# Patient Record
Sex: Female | Born: 1995 | Race: Black or African American | Hispanic: No | Marital: Single | Smoking: Light tobacco smoker
Health system: Southern US, Community
[De-identification: ages and names within clinical notes are randomized; demographics above are authoritative.]

---
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
## 2015-03-01 ENCOUNTER — Emergency Department (HOSPITAL_COMMUNITY): Payer: Medicaid Other

## 2015-03-01 ENCOUNTER — Emergency Department (HOSPITAL_COMMUNITY)
Admission: EM | Admit: 2015-03-01 | Discharge: 2015-03-01 | Disposition: A | Discharge status: Home / Self Care | Payer: Medicaid Other | Attending: Emergency Medicine | Admitting: Emergency Medicine

## 2015-03-01 ENCOUNTER — Encounter (HOSPITAL_COMMUNITY): Payer: Self-pay | Admitting: Nurse Practitioner

## 2015-03-01 DIAGNOSIS — F17210 Nicotine dependence, cigarettes, uncomplicated: Secondary | ICD-10-CM | POA: Insufficient documentation

## 2015-03-01 DIAGNOSIS — Y998 Other external cause status: Secondary | ICD-10-CM | POA: Diagnosis not present

## 2015-03-01 DIAGNOSIS — Y9389 Activity, other specified: Secondary | ICD-10-CM | POA: Diagnosis not present

## 2015-03-01 DIAGNOSIS — W268XXA Contact with other sharp object(s), not elsewhere classified, initial encounter: Secondary | ICD-10-CM | POA: Insufficient documentation

## 2015-03-01 DIAGNOSIS — S61411A Laceration without foreign body of right hand, initial encounter: Secondary | ICD-10-CM | POA: Insufficient documentation

## 2015-03-01 DIAGNOSIS — Y9289 Other specified places as the place of occurrence of the external cause: Secondary | ICD-10-CM | POA: Insufficient documentation

## 2015-03-01 NOTE — Discharge Instructions (Signed)
Nonsutured Laceration Care °A laceration is a cut that goes through all layers of the skin and extends into the tissue that is right under the skin. This type of cut is usually stitched up (sutured) or closed with tape (adhesive strips) or skin glue shortly after the injury happens. °However, if the wound is dirty or if several hours pass before medical treatment is provided, it is likely that germs (bacteria) will enter the wound. Closing a laceration after bacteria have entered it increases the risk of infection. In these cases, your health care provider may leave the laceration open (nonsutured) and cover it with a bandage. This type of treatment helps prevent infection and allows the wound to heal from the deepest layer of tissue damage up to the surface. °An open fracture is a type of injury that may involve nonsutured lacerations. An open fracture is a break in a bone that happens along with one or more lacerations through the skin that is near the fracture site. °HOW TO CARE FOR YOUR NONSUTURED LACERATION °· Take or apply over-the-counter and prescription medicines only as told by your health care provider. °· If you were prescribed an antibiotic medicine, take or apply it as told by your health care provider. Do not stop using the antibiotic even if your condition improves. °· Clean the wound one time each day or as told by your health care provider. °¨ Wash the wound with mild soap and water. °¨ Rinse the wound with water to remove all soap. °¨ Pat your wound dry with a clean towel. Do not rub the wound. °· Do not inject anything into the wound unless your health care provider told you to. °· Change any bandages (dressings) as told by your health care provider. This includes changing the dressing if it gets wet, dirty, or starts to smell bad. °· Keep the dressing dry until your health care provider says it can be removed. Do not take baths, swim, or do anything that puts your wound underwater until your  health care provider approves. °· Raise (elevate) the injured area above the level of your heart while you are sitting or lying down, if possible. °· Do not scratch or pick at the wound. °· Check your wound every day for signs of infection. Watch for: °¨ Redness, swelling, or pain. °¨ Fluid, blood, or pus. °· Keep all follow-up visits as told by your health care provider. This is important. °SEEK MEDICAL CARE IF: °· You received a tetanus and shot and you have swelling, severe pain, redness, or bleeding at the injection site.   °· You have a fever. °· Your pain is not controlled with medicine. °· You have increased redness, swelling, or pain at the site of your wound. °· You have fluid, blood, or pus coming from your wound. °· You notice a bad smell coming from your wound or your dressing. °· You notice something coming out of the wound, such as wood or glass. °· You notice a change in the color of your skin near your wound. °· You develop a new rash. °· You need to change the dressing frequently due to fluid, blood, or pus draining from the wound. °· You develop numbness around your wound. °SEEK IMMEDIATE MEDICAL CARE IF: °· Your pain suddenly increases and is severe. °· You develop severe swelling around the wound. °· The wound is on your hand or foot and you cannot properly move a finger or toe. °· The wound is on your hand or   foot and you notice that your fingers or toes look pale or bluish.  You have a red streak going away from your wound.   This information is not intended to replace advice given to you by your health care provider. Make sure you discuss any questions you have with your health care provider.   Document Released: 02/09/2006 Document Revised: 07/29/2014 Document Reviewed: 03/10/2014 Elsevier Interactive Patient Education 2016 [employer]  Laceration Care, Adult A laceration is a cut that goes through all of the layers of the skin and into the tissue that is right under the skin.  Some lacerations heal on their own. Others need to be closed with stitches (sutures), staples, skin adhesive strips, or skin glue. Proper laceration care minimizes the risk of infection and helps the laceration to heal better. HOW TO CARE FOR YOUR LACERATION If sutures or staples were used:  Keep the wound clean and dry.  If you were given a bandage (dressing), you should change it at least one time per day or as told by your health care provider. You should also change it if it becomes wet or dirty.  Keep the wound completely dry for the first 24 hours or as told by your health care provider. After that time, you may shower or bathe. However, make sure that the wound is not soaked in water until after the sutures or staples have been removed.  Clean the wound one time each day or as told by your health care provider:  Wash the wound with soap and water.  Rinse the wound with water to remove all soap.  Pat the wound dry with a clean towel. Do not rub the wound.  After cleaning the wound, apply a thin layer of antibiotic ointmentas told by your health care provider. This will help to prevent infection and keep the dressing from sticking to the wound.  Have the sutures or staples removed as told by your health care provider. If skin adhesive strips were used:  Keep the wound clean and dry.  If you were given a bandage (dressing), you should change it at least one time per day or as told by your health care provider. You should also change it if it becomes dirty or wet.  Do not get the skin adhesive strips wet. You may shower or bathe, but be careful to keep the wound dry.  If the wound gets wet, pat it dry with a clean towel. Do not rub the wound.  Skin adhesive strips fall off on their own. You may trim the strips as the wound heals. Do not remove skin adhesive strips that are still stuck to the wound. They will fall off in time. If skin glue was used:  Try to keep the wound dry,  but you may briefly wet it in the shower or bath. Do not soak the wound in water, such as by swimming.  After you have showered or bathed, gently pat the wound dry with a clean towel. Do not rub the wound.  Do not do any activities that will make you sweat heavily until the skin glue has fallen off on its own.  Do not apply liquid, cream, or ointment medicine to the wound while the skin glue is in place. Using those may loosen the film before the wound has healed.  If you were given a bandage (dressing), you should change it at least one time per day or as told by your health care provider. You should  make you sweat heavily until the skin glue has fallen off on its own.   Do not apply liquid, cream, or ointment medicine to the wound while the skin glue is in place. Using those may loosen the film before the wound has healed.   If you were given a bandage (dressing), you should change it at least one time per day or as told by your health care provider. You should also change it if it becomes dirty or wet.   If a dressing is placed over the wound, be careful not to apply tape directly over the skin glue. Doing that may cause the glue to be pulled off before the wound has healed.   Do not pick at the glue. The skin glue usually remains in place for 5-10 days, then it falls off of the skin.  General Instructions   Take over-the-counter and prescription medicines only as told by your health care provider.   If you were prescribed an antibiotic medicine or ointment, take or apply it as told by your doctor. Do not stop using it even if your condition improves.   To help prevent scarring, make sure to cover your wound with sunscreen whenever you are outside after stitches are removed, after adhesive strips are removed, or when glue remains in place and the wound is healed. Make sure to wear a sunscreen of at least 30 SPF.   Do not scratch or pick at the wound.   Keep all follow-up visits as told by your health care provider. This is important.   Check your wound every day for signs of infection. Watch for:    Redness, swelling, or pain.    Fluid, blood, or pus.   Raise (elevate) the injured area above the level of your heart while you are sitting or lying down, if possible.  SEEK MEDICAL CARE IF:   You received a tetanus shot and you have swelling, severe pain, redness, or  bleeding at the injection site.   You have a fever.   A wound that was closed breaks open.   You notice a bad smell coming from your wound or your dressing.   You notice something coming out of the wound, such as wood or glass.   Your pain is not controlled with medicine.   You have increased redness, swelling, or pain at the site of your wound.   You have fluid, blood, or pus coming from your wound.   You notice a change in the color of your skin near your wound.   You need to change the dressing frequently due to fluid, blood, or pus draining from the wound.   You develop a new rash.   You develop numbness around the wound.  SEEK IMMEDIATE MEDICAL CARE IF:   You develop severe swelling around the wound.   Your pain suddenly increases and is severe.   You develop painful lumps near the wound or on skin that is anywhere on your body.   You have a red streak going away from your wound.   The wound is on your hand or foot and you cannot properly move a finger or toe.   The wound is on your hand or foot and you notice that your fingers or toes look pale or bluish.     This information is not intended to replace advice given to you by your health care provider. Make sure you discuss any questions you have with your health care provider.

## 2015-03-01 NOTE — ED Notes (Signed)
Pt presents with a right palm laceration that she reports she sustained from a tin cutter. Bleeding controlled at this time.

## 2015-03-01 NOTE — ED Provider Notes (Signed)
CSN: [id number]     Arrival date & time 03/01/15  0031 History   First MD Initiated Contact with Patient 03/01/15 0150     Chief Complaint  Patient presents with  . Laceration     (Consider location/radiation/quality/duration/timing/severity/associated sxs/prior Treatment) Patient is a 19 y.o. female presenting with skin laceration. The history is provided by the patient. No language interpreter was used.  Laceration Location:  Hand Hand laceration location:  R hand Length (cm):  2 Depth:  Through dermis Bleeding: controlled   Laceration mechanism:  Metal edge Pain details:    Quality:  Aching   Severity:  Mild   Timing:  Constant Foreign body present:  No foreign bodies Relieved by:  Nothing Worsened by:  Nothing tried Tetanus status:  Up to date   History reviewed. No pertinent past medical history. History reviewed. No pertinent past surgical history. History reviewed. No pertinent family history. Social History  Substance Use Topics  . Smoking status: Light Tobacco Smoker    Types: Cigarettes  . Smokeless tobacco: Never Used  . Alcohol Use: Yes   OB History    No data available     Review of Systems  Skin: Positive for wound.  All other systems reviewed and are negative.     Allergies  Review of patient's allergies indicates no known allergies.  Home Medications   Prior to Admission medications   Not on File   BP 118/63 mmHg  Pulse 60  Temp(Src) 97.7 F (36.5 C) (Oral)  Resp 16  SpO2 100%  LMP 03/01/2015 (Exact Date) Physical Exam  Constitutional: She is oriented to person, place, and time. She appears well-developed and well-nourished.  Musculoskeletal:  2cm superficial laceration, from hand, nv and ns intact  Neurological: She is alert and oriented to person, place, and time. She has normal reflexes.  Skin: Skin is warm.  Psychiatric: She has a normal mood and affect.  Nursing note and vitals reviewed.   ED Course  .Marland Kitchen.Laceration  Repair Date/Time: 03/01/2015 6:29 AM Performed by: Elson AreasSOFIA, Schylar Wuebker K Authorized by: Elson AreasSOFIA, Lanetra Hartley K Consent: Verbal consent obtained. Risks and benefits: risks, benefits and alternatives were discussed Patient understanding: patient states understanding of the procedure being performed Required items: required blood products, implants, devices, and special equipment available Body area: upper extremity Location details: right hand Laceration length: 2 cm Foreign bodies: no foreign bodies Tendon involvement: none Preparation: Patient was prepped and draped in the usual sterile fashion. Amount of cleaning: extensive Skin closure: glue Approximation difficulty: simple   (including critical care time) Labs Review Labs Reviewed - No data to display  Imaging Review No results found. I have personally reviewed and evaluated these images and lab results as part of my medical decision-making.   EKG Interpretation None      MDM Pt counseled on dermabond and healing.    Final diagnoses:  Laceration of hand, right, initial encounter    An After Visit Summary was printed and given to the patient.   Lonia SkinnerLeslie K BostonSofia, PA-C 03/01/15 0630  Gilda Creasehristopher J Pollina, MD 03/01/15 [phone]

## 2017-05-23 ENCOUNTER — Other Ambulatory Visit: Payer: Self-pay

## 2017-05-23 ENCOUNTER — Emergency Department (HOSPITAL_COMMUNITY): Payer: Self-pay

## 2017-05-23 ENCOUNTER — Encounter (HOSPITAL_COMMUNITY): Payer: Self-pay | Admitting: Emergency Medicine

## 2017-05-23 ENCOUNTER — Emergency Department (HOSPITAL_COMMUNITY)
Admission: EM | Admit: 2017-05-23 | Discharge: 2017-05-23 | Disposition: A | Discharge status: Home / Self Care | Payer: Self-pay | Attending: Emergency Medicine | Admitting: Emergency Medicine

## 2017-05-23 DIAGNOSIS — F17210 Nicotine dependence, cigarettes, uncomplicated: Secondary | ICD-10-CM | POA: Insufficient documentation

## 2017-05-23 DIAGNOSIS — M25511 Pain in right shoulder: Secondary | ICD-10-CM | POA: Insufficient documentation

## 2017-05-23 MED ORDER — IBUPROFEN 400 MG PO TABS
600.0000 mg | ORAL_TABLET | Freq: Once | ORAL | Status: AC
  Administered 2017-05-23: 600 mg via ORAL
  Filled 2017-05-23: qty 1

## 2017-05-23 NOTE — ED Provider Notes (Signed)
Patient placed in Quick Look pathway, seen and evaluated   Chief Complaint: neck pain, right-sided chest pain  HPI: Patient presents for evaluation of acute onset, constant right-sided neck pain and right shoulder pain which occurred upon awakening today.  She states pain is aching and sharp, worsens with lateral rotation of the neck and abduction of the right shoulder.  Denies any known trauma or falls.  She states that earlier today at around noon she experienced sudden onset of radiation of the pain to the right side of the chest anteriorly as well as associated nausea and lightheadedness.  She states that she felt flushed at the time.  Episode lasted for approximately 1 minute before resolving.  No syncope.  She smokes marijuana daily but does not smoke cigarettes.  No other recreational drug use.  Denies excessive caffeine intake.  ROS: +neck pain +arthralgias +chest pain +nausea +lightheadedness -cough - SOB - numbness -weakness  Physical Exam:   Gen: No distress  Neuro: Awake and Alert  Skin: Warm    Focused Exam: Equal rise and fall of chest, no increased work of breathing.  No chest wall tenderness to palpation.  No midline cervical spine tenderness.  Right paraspinal muscle tenderness noted overlying the trapezius muscles.  Pain elicited with lateral rotation of the neck to the right and abduction of the right shoulder.  No deformity, crepitus, or step-off noted.   Initiation of care has begun. The patient has been counseled on the process, plan, and necessity for staying for the completion/evaluation, and the remainder of the medical screening examination    Bennye AlmFawze, Maleya Leever A, PA-C 05/23/17 1450    Gerhard MunchLockwood, Robert, MD 05/23/17 [phone]

## 2017-05-23 NOTE — Discharge Instructions (Signed)
Please read attached information. If you experience any new or worsening signs or symptoms please return to the emergency room for evaluation. Please follow-up with your primary care provider or specialist as discussed.  °

## 2017-05-23 NOTE — ED Provider Notes (Signed)
[facility] EMERGENCY DEPARTMENT Provider Note   CSN: [id number] Arrival date & time: 05/23/17  1306   History   Chief Complaint Chief Complaint  Patient presents with  . Chest Pain    HPI Michelle Maddox is a 22 y.o. female.  HPI   22 year old female presents today with complaints of right arm pain.  Patient reports that she woke up this morning and was having pain in the right lateral neck and shoulder.  She notes movement of the neck caused worsening of symptoms, also noted worsening pain with movement of the shoulder.  Patient denies any loss of distal sensation strength and motor function.  She notes at one point the pain was so severe that was causing radiation of pain into her chest.  She notes the car ride over here because jostling of her shoulder causing chest pain.  Patient reports she had a brief episode of nausea and shortness of breath earlier, none presently.  She notes the right arm pain has significantly improved.  No history DVT or PE, no significant risk factors.  Patient without any significant cardiac risk factors.  Symptoms improved with ibuprofen.  Patient reports she works at [employer] and does lift heavy boxes.  History reviewed. No pertinent past medical history.  There are no active problems to display for this patient.   History reviewed. No pertinent surgical history.  OB History    No data available       Home Medications    Prior to Admission medications   Not on File    Family History No family history on file.  Social History Social History   Tobacco Use  . Smoking status: Light Tobacco Smoker    Types: Cigarettes  . Smokeless tobacco: Never Used  Substance Use Topics  . Alcohol use: Yes  . Drug use: Yes    Types: Marijuana     Allergies   Patient has no known allergies.   Review of Systems Review of Systems  All other systems reviewed and are negative.  Physical Exam Updated Vital Signs BP 114/75    Pulse 92   Temp 99.8 F (37.7 C) (Oral)   Resp 18   Ht 5' (1.524 m)   Wt 53.5 kg (118 lb)   LMP 05/22/2017   SpO2 98%   BMI 23.05 kg/m   Physical Exam  Constitutional: She is oriented to person, place, and time. She appears well-developed and well-nourished.  HENT:  Head: Normocephalic and atraumatic.  Eyes: Conjunctivae are normal. Pupils are equal, round, and reactive to light. Right eye exhibits no discharge. Left eye exhibits no discharge. No scleral icterus.  Neck: Normal range of motion. No JVD present. No tracheal deviation present.  Cardiovascular: Normal rate, regular rhythm, normal heart sounds and intact distal pulses. Exam reveals no gallop and no friction rub.  No murmur heard. Pulmonary/Chest: Effort normal and breath sounds normal. No stridor. No respiratory distress. She has no wheezes. She has no rales. She exhibits no tenderness.  Musculoskeletal:  No pain with axial compression of the cervical spine, no spinal tenderness palpation, no tenderness palpation of the trapezius, minor pain with abduction of the right shoulder, no pulse 2+, sensation intact  Neurological: She is alert and oriented to person, place, and time. Coordination normal.  Psychiatric: She has a normal mood and affect. Her behavior is normal. Judgment and thought content normal.  Nursing note and vitals reviewed.    ED Treatments / Results  Labs (all labs  ordered are listed, but only abnormal results are displayed) Labs Reviewed - No data to display  EKG  EKG Interpretation None       Radiology Dg Chest 2 View  Result Date: 05/23/2017 CLINICAL DATA:  Chest pain in the sternal region EXAM: CHEST  2 VIEW COMPARISON:  None. FINDINGS: The heart size and mediastinal contours are within normal limits. Both lungs are clear. Minimal scoliosis. Bilateral nipple piercings. IMPRESSION: No active cardiopulmonary disease. Electronically Signed   By: Jasmine PangKim  Fujinaga M.D.   On: 05/23/2017 17:08     Procedures Procedures (including critical care time)  Medications Ordered in ED Medications  ibuprofen (ADVIL,MOTRIN) tablet 600 mg (600 mg Oral Given 05/23/17 1640)     Initial Impression / Assessment and Plan / ED Course  I have reviewed the triage vital signs and the nursing notes.  Pertinent labs & imaging results that were available during my care of the patient were reviewed by me and considered in my medical decision making (see chart for details).     Final Clinical Impressions(s) / ED Diagnoses   Final diagnoses:  Acute pain of right shoulder   Labs:   Imaging: DG chest 2 view, ED EKG without acute findings  Consults:  Therapeutics:  Discharge Meds:   Assessment/Plan: 22 year old female presents today with complaints of arm pain.  She does have some radiation of pain into her chest.  She has no signs of ACS, PE, or dissection.  She has reproducible neck and shoulder pain here.  Symptoms very minimal at this time.  Patient will be discharged with symptomatic care instructions and strict return precautions.   ED Discharge Orders    None       Eyvonne MechanicHedges, Jakobie Henslee, Cordelia Poche-C 05/23/17 1749    Vanetta MuldersZackowski, Scott, MD 05/24/17 [phone]

## 2017-05-23 NOTE — ED Notes (Signed)
VSS. Pt ambulatory to lobby with steady gait and all belongings. Pt verbalized understanding of d/c instructions and f/u.

## 2017-05-23 NOTE — ED Triage Notes (Signed)
Per Pt. Pt woke up this morning and had R arm pain. At noon she started to develop R sided CP and nausea. Pt felt like she got very flush. VSS. A&Ox4

## 2019-04-01 IMAGING — DX DG CHEST 2V
2 series · 2 of 2 positions shown · non-contrast
Comparison: None.

CLINICAL DATA: Chest pain in the sternal region

EXAM:
CHEST  2 VIEW

[chest pa]
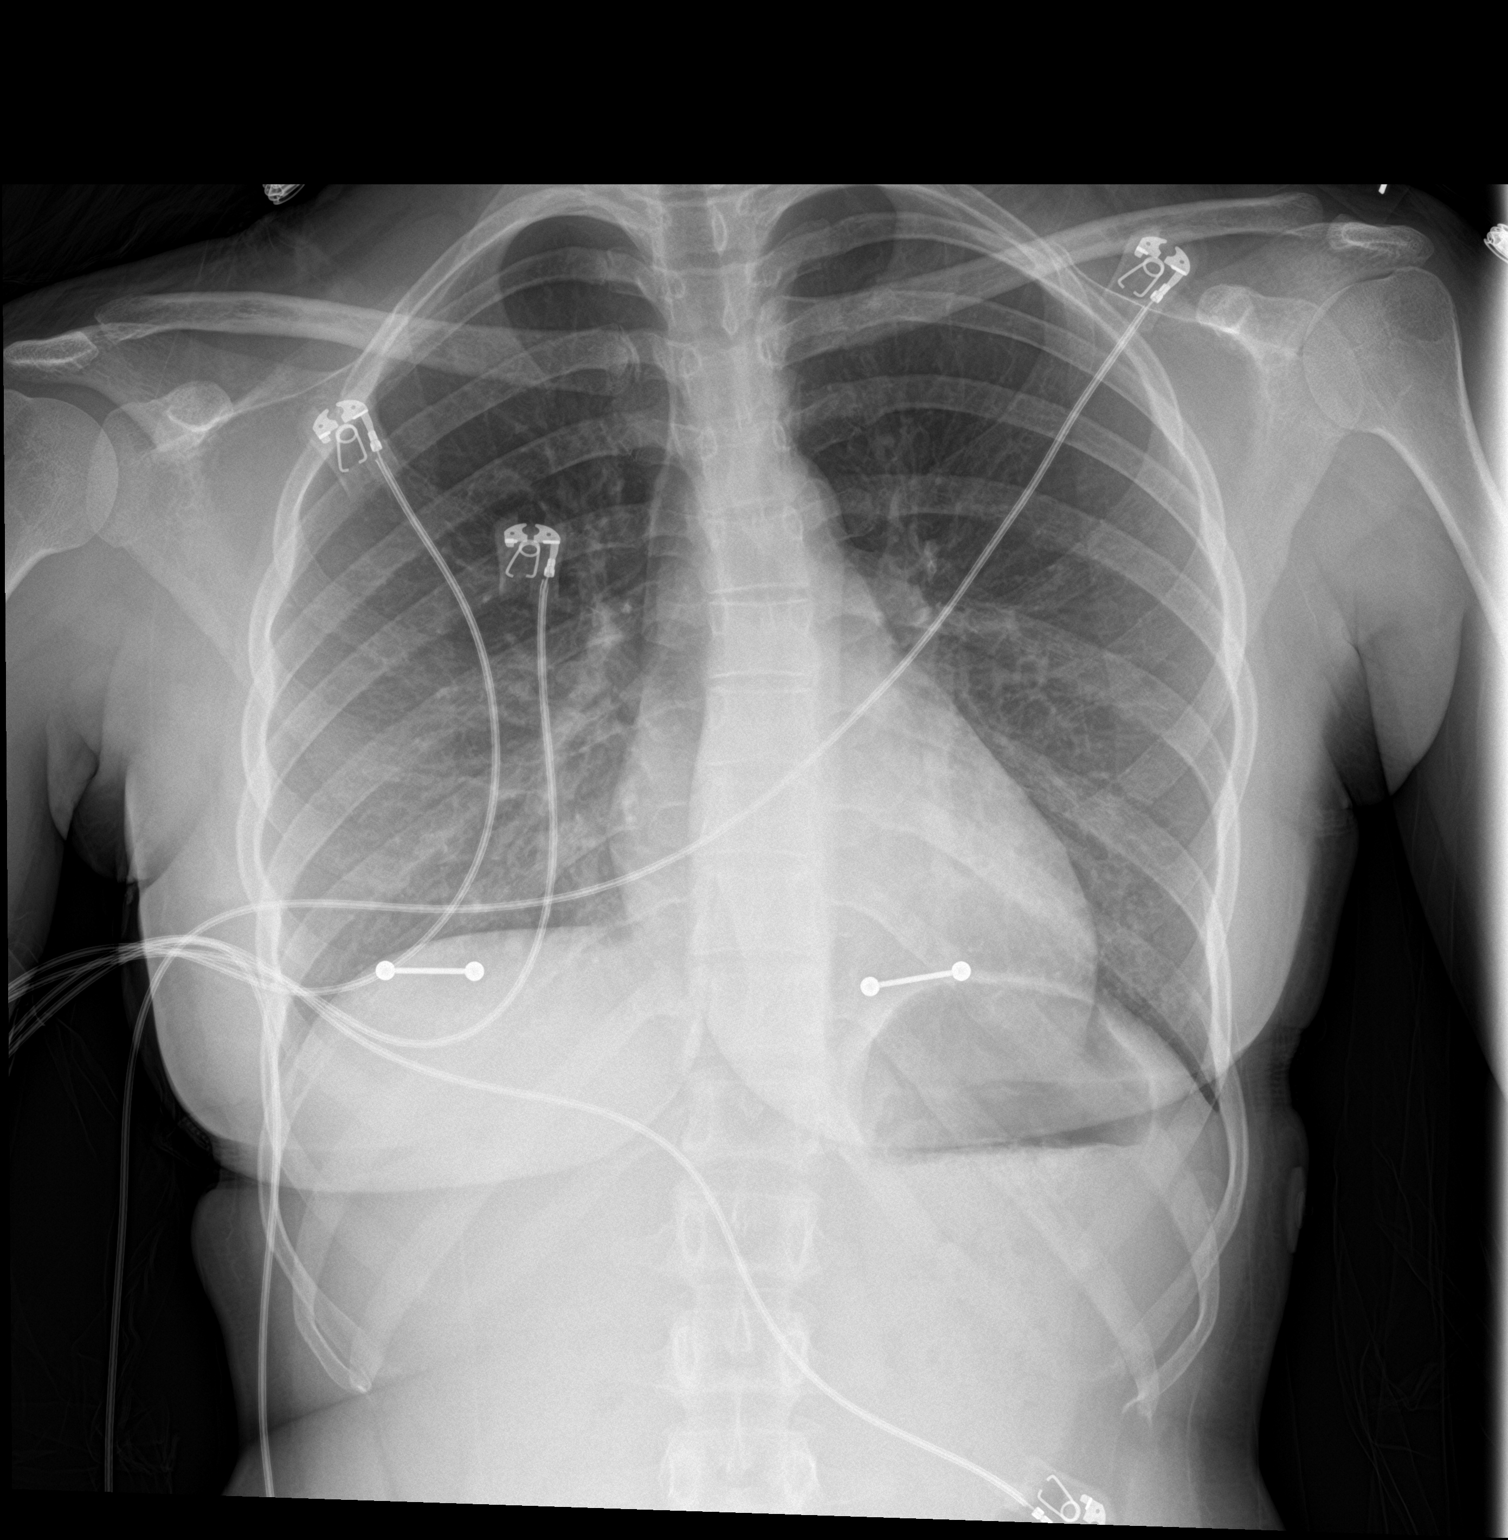

[chest lat]
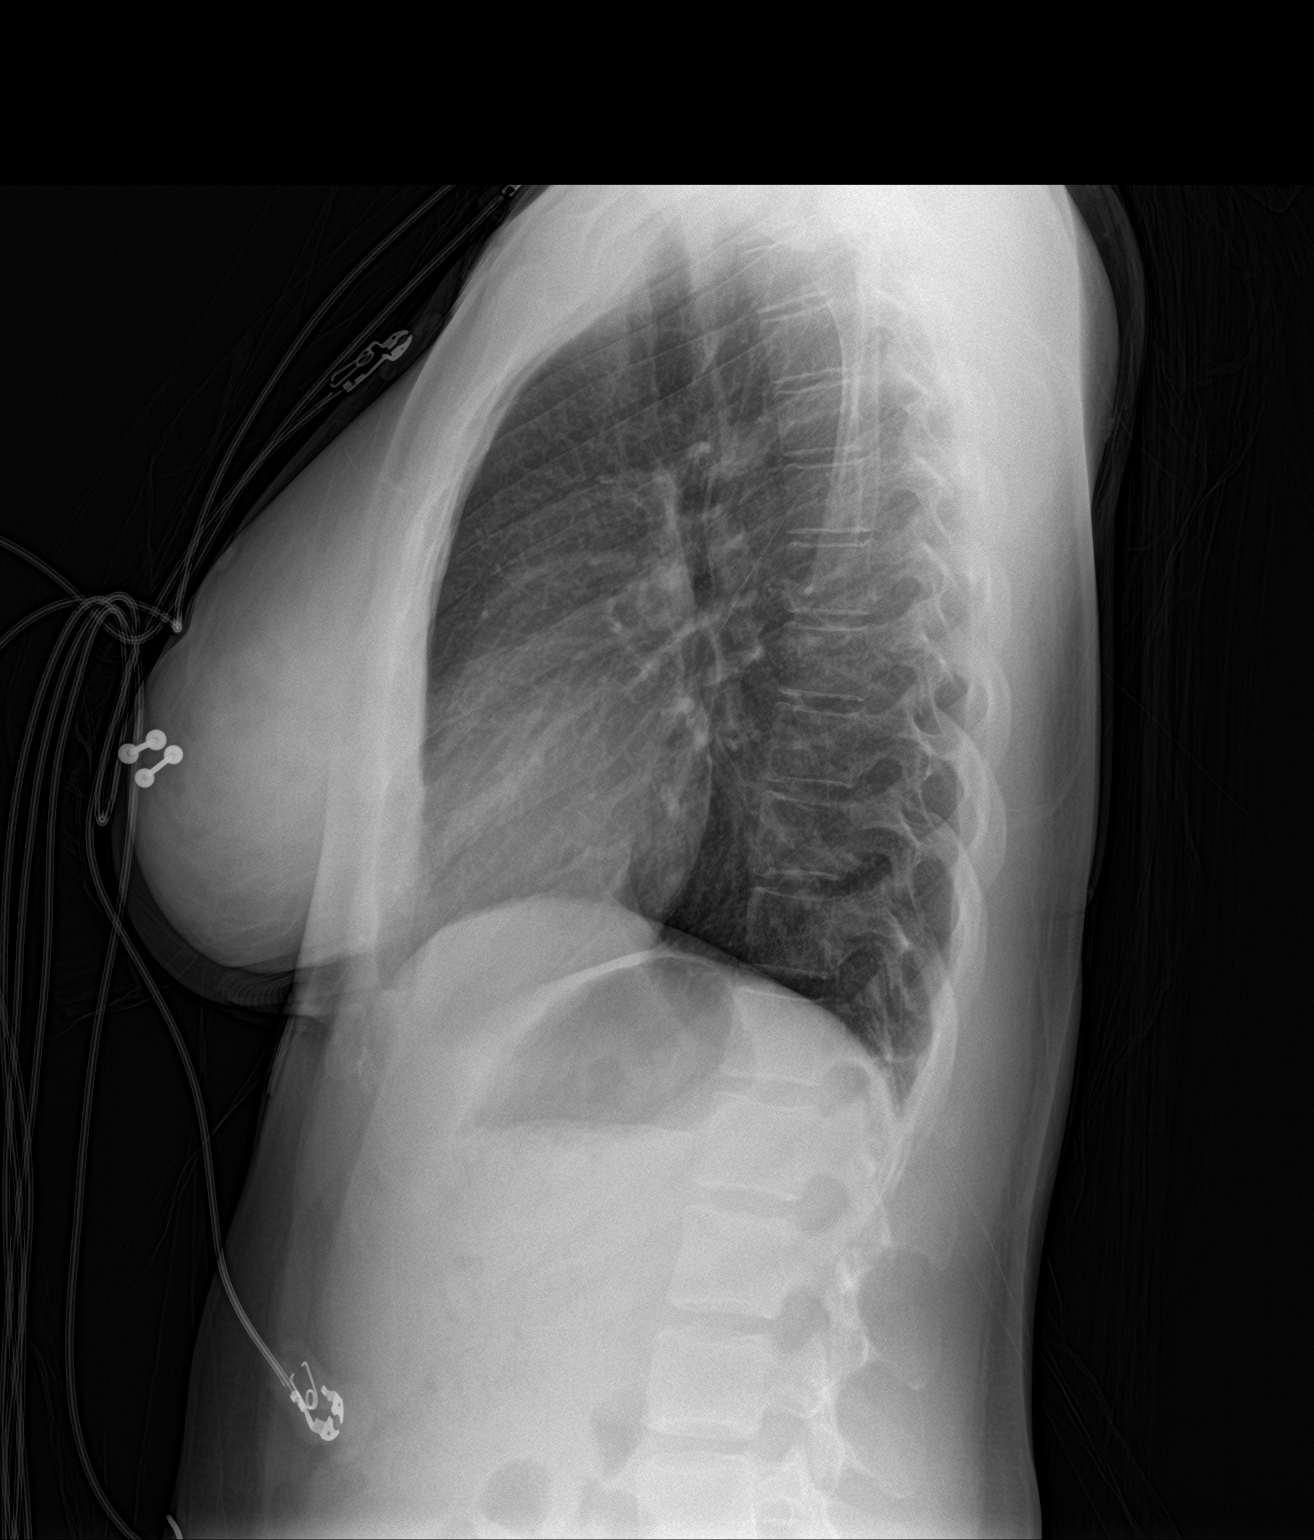

[2 of 2 positions shown; findings below may reference images not displayed]

FINDINGS: The heart size and mediastinal contours are within normal limits.
Both lungs are clear. Minimal scoliosis. Bilateral nipple piercings.
IMPRESSION: No active cardiopulmonary disease.

## 2019-08-08 ENCOUNTER — Other Ambulatory Visit: Payer: Self-pay

## 2019-08-08 ENCOUNTER — Emergency Department
Admission: EM | Admit: 2019-08-08 | Discharge: 2019-08-08 | Disposition: A | Discharge status: Home / Self Care | Payer: Managed Care, Other (non HMO) | Attending: Emergency Medicine | Admitting: Emergency Medicine

## 2019-08-08 DIAGNOSIS — F339 Major depressive disorder, recurrent, unspecified: Secondary | ICD-10-CM | POA: Diagnosis present

## 2019-08-08 DIAGNOSIS — R45851 Suicidal ideations: Secondary | ICD-10-CM | POA: Diagnosis not present

## 2019-08-08 DIAGNOSIS — F17210 Nicotine dependence, cigarettes, uncomplicated: Secondary | ICD-10-CM | POA: Diagnosis not present

## 2019-08-08 DIAGNOSIS — F330 Major depressive disorder, recurrent, mild: Secondary | ICD-10-CM | POA: Insufficient documentation

## 2019-08-08 DIAGNOSIS — F32A Depression, unspecified: Secondary | ICD-10-CM

## 2019-08-08 DIAGNOSIS — Z20822 Contact with and (suspected) exposure to covid-19: Secondary | ICD-10-CM | POA: Diagnosis not present

## 2019-08-08 DIAGNOSIS — J029 Acute pharyngitis, unspecified: Secondary | ICD-10-CM | POA: Diagnosis present

## 2019-08-08 LAB — CBC
HCT: 41.6 % (ref 36.0–46.0)
Hemoglobin: 13.8 g/dL (ref 12.0–15.0)
MCH: 29.1 pg (ref 26.0–34.0)
MCHC: 33.2 g/dL (ref 30.0–36.0)
MCV: 87.8 fL (ref 80.0–100.0)
Platelets: 293 K/uL (ref 150–400)
RBC: 4.74 MIL/uL (ref 3.87–5.11)
RDW: 12.6 % (ref 11.5–15.5)
WBC: 5.8 K/uL (ref 4.0–10.5)
nRBC: 0 % (ref 0.0–0.2)

## 2019-08-08 LAB — URINE DRUG SCREEN, QUALITATIVE (ARMC ONLY)
Amphetamines, Ur Screen: NOT DETECTED
Barbiturates, Ur Screen: NOT DETECTED
Benzodiazepine, Ur Scrn: NOT DETECTED
Cannabinoid 50 Ng, Ur ~~LOC~~: POSITIVE — AB
Cocaine Metabolite,Ur ~~LOC~~: NOT DETECTED
MDMA (Ecstasy)Ur Screen: NOT DETECTED
Methadone Scn, Ur: NOT DETECTED
Opiate, Ur Screen: NOT DETECTED
Phencyclidine (PCP) Ur S: NOT DETECTED
Tricyclic, Ur Screen: NOT DETECTED

## 2019-08-08 LAB — POCT PREGNANCY, URINE: Preg Test, Ur: NEGATIVE

## 2019-08-08 LAB — COMPREHENSIVE METABOLIC PANEL WITH GFR
ALT: 15 U/L (ref 0–44)
AST: 19 U/L (ref 15–41)
Albumin: 5 g/dL (ref 3.5–5.0)
Alkaline Phosphatase: 55 U/L (ref 38–126)
Anion gap: 7 (ref 5–15)
BUN: 9 mg/dL (ref 6–20)
CO2: 27 mmol/L (ref 22–32)
Calcium: 9.8 mg/dL (ref 8.9–10.3)
Chloride: 104 mmol/L (ref 98–111)
Creatinine, Ser: 0.71 mg/dL (ref 0.44–1.00)
GFR calc Af Amer: >60 mL/min (ref >60)
GFR calc non Af Amer: >60 mL/min (ref >60)
Glucose, Bld: 109 mg/dL — ABNORMAL HIGH (ref 70–99)
Potassium: 3.8 mmol/L (ref 3.5–5.1)
Sodium: 138 mmol/L (ref 135–145)
Total Bilirubin: 0.8 mg/dL (ref 0.3–1.2)
Total Protein: 8.4 g/dL — ABNORMAL HIGH (ref 6.5–8.1)

## 2019-08-08 LAB — ACETAMINOPHEN LEVEL: Acetaminophen (Tylenol), Serum: <10 ug/mL — ABNORMAL LOW (ref 10–30)

## 2019-08-08 LAB — SALICYLATE LEVEL: Salicylate Lvl: <7 mg/dL — ABNORMAL LOW (ref 7.0–30.0)

## 2019-08-08 LAB — SARS CORONAVIRUS 2 BY RT PCR (HOSPITAL ORDER, PERFORMED IN ~~LOC~~ HOSPITAL LAB): SARS Coronavirus 2: NEGATIVE

## 2019-08-08 LAB — ETHANOL: Alcohol, Ethyl (B): <10 mg/dL (ref <10)

## 2019-08-08 MED ORDER — DIPHENHYDRAMINE HCL 25 MG PO CAPS
50.0000 mg | ORAL_CAPSULE | Freq: Once | ORAL | Status: AC
  Administered 2019-08-08: 50 mg via ORAL
  Filled 2019-08-08: qty 2

## 2019-08-08 NOTE — ED Triage Notes (Signed)
Pt here with a sore throat that started on 07/23/19 with a headache. No other symptoms, pt in NAD.

## 2019-08-08 NOTE — ED Notes (Addendum)
Patient Items:  Black Clogs Gray Yoga Pants Stripe Socks Cheetah Panties Pink Sweat jacket Camo Jacket Gray T-Shirt [employer] Bra Two Nipple Piercing (Silver/ Diamond) Hair Band [employer] (Passport, ID, and Credit Cards) Estate agent

## 2019-08-08 NOTE — ED Notes (Signed)
Dinner provided.

## 2019-08-08 NOTE — ED Provider Notes (Signed)
[facility] Emergency Department Provider Note  ____________________________________________   I have reviewed the triage vital signs and the nursing notes.   HISTORY  Chief Complaint Suicidal and Sore Throat   History limited by: Not Limited   HPI Michelle Maddox is a 24 y.o. female who presents to the emergency department today because of concerns for sore throat and needing Covid test for work however during triage she mentioned she has had thoughts of wanting hurt her self the past 30 days.  Patient states he has a long history of depression.  She feels burned out at her job and that she has no one to talk to.  She states that she is thought about going to sleep and never waking up however has no plans.  In terms of the throat discomfort she describes it as an itchiness.  It has been going on for the past 2 weeks.  She is thinking it could be due to an allergy.  It is worse when she is at her apartment.  She has not tried taking any allergy medications.  Records reviewed.   There are no problems to display for this patient.   No past surgical history on file.  Prior to Admission medications   Not on File    Allergies Patient has no known allergies.  No family history on file.  Social History Social History   Tobacco Use  . Smoking status: Light Tobacco Smoker    Types: Cigarettes  . Smokeless tobacco: Never Used  Substance Use Topics  . Alcohol use: Yes  . Drug use: Yes    Types: Marijuana    Review of Systems Constitutional: No fever/chills Eyes: No visual changes. ENT: Positive for itchy throat Cardiovascular: Denies chest pain. Respiratory: Denies shortness of breath. Gastrointestinal: No abdominal pain.  No nausea, no vomiting.  No diarrhea.   Genitourinary: Negative for dysuria. Musculoskeletal: Negative for back pain. Skin: Negative for rash. Neurological: Negative for headaches, focal weakness or  numbness.  ____________________________________________   PHYSICAL EXAM:  VITAL SIGNS: ED Triage Vitals [08/08/19 1613]  Enc Vitals Group     BP (!) 146/79     Pulse Rate 60     Resp 18     Temp 98.6 F (37 C)     Temp Source Oral     SpO2 100 %     Weight 115 lb (52.2 kg)     Height 5' (1.524 m)     Head Circumference      Peak Flow      Pain Score 0   Constitutional: Alert and oriented.  Eyes: Conjunctivae are normal.  ENT      Head: Normocephalic and atraumatic.      Nose: No congestion/rhinnorhea.      Mouth/Throat: Mucous membranes are moist.      Neck: No stridor. Hematological/Lymphatic/Immunilogical: No cervical lymphadenopathy. Cardiovascular: Normal rate, regular rhythm.  No murmurs, rubs, or gallops.  Respiratory: Normal respiratory effort without tachypnea nor retractions. Breath sounds are clear and equal bilaterally. No wheezes/rales/rhonchi. Gastrointestinal: Soft and non tender. No rebound. No guarding.  Genitourinary: Deferred Musculoskeletal: Normal range of motion in all extremities. No lower extremity edema. Neurologic:  Normal speech and language. No gross focal neurologic deficits are appreciated.  Skin:  Skin is warm, dry and intact. No rash noted. Psychiatric: Mood and affect are normal. Speech and behavior are normal. Patient exhibits appropriate insight and judgment.  ____________________________________________    LABS (pertinent positives/negatives)  UDS positive  cannabinoid Upreg negative Acetaminophen, salicylate, ethanol below threshold CBC wbc 5.8, hgb 13.8, plt 293 CMP wnl except glu 109, t pro 8.4  ____________________________________________   EKG  None  ____________________________________________    RADIOLOGY  None  ____________________________________________   PROCEDURES  Procedures  ____________________________________________   INITIAL IMPRESSION / ASSESSMENT AND PLAN / ED COURSE  Pertinent labs &  imaging results that were available during my care of the patient were reviewed by me and considered in my medical decision making (see chart for details).   Patient presented to the emergency department today with primary concerns for itchy throat for 2 weeks.  However during triage also mentioned that she has felt depressed and has had intermittent thoughts of suicidal ideation.  Because of this patient was evaluated by psychiatry.  They did not feel the patient necessitated inpatient admission at this time.  Will give her outpatient follow-up information. In terms of the throat issue, the benadryl did help.   ____________________________________________   FINAL CLINICAL IMPRESSION(S) / ED DIAGNOSES  Final diagnoses:  Depression, unspecified depression type     Note: This dictation was prepared with Dragon dictation. Any transcriptional errors that result from this process are unintentional     Phineas Semen, MD 08/08/19 2243

## 2019-08-08 NOTE — Discharge Instructions (Addendum)
Please seek medical attention and help for any thoughts about wanting to harm yourself, harm others, any concerning change in behavior, severe depression, inappropriate drug use or any other new or concerning symptoms. ° °

## 2019-08-08 NOTE — ED Notes (Signed)
Pt could not give a urine sample at this time.

## 2019-08-08 NOTE — ED Notes (Signed)
Sent red,green,and purple top tubes to lab. 

## 2019-08-08 NOTE — ED Notes (Signed)
Patient reports here for itchy throat and while being trige was asked if she felt like hurting herself in the last 6 months, patient responded yes to triage nurse. Patient reports to Clinical research associate that she is feeling overwhelmed with life issues, stating that work has been difficult and that she has no personal life. Reports feeling dissatisfied with her life and has thought about SI but no attempt. Awaiting psych eval and further plan of care. Patient calm and cooperative with flat affect when telling her story. Safety maintained will continue to monitor.

## 2019-08-08 NOTE — ED Notes (Signed)
Pt. Alert and oriented, warm and dry, in no distress. Pt. Denies SI, HI, and AVH. Pt states she has been very stressed due to her job. 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 Pt. Encouraged to let nursing staff know of any concerns or needs.

## 2019-08-08 NOTE — ED Notes (Signed)
Labs sent from triage awaiting urine specimen.

## 2019-08-08 NOTE — ED Notes (Signed)

## 2019-08-09 DIAGNOSIS — F339 Major depressive disorder, recurrent, unspecified: Secondary | ICD-10-CM | POA: Diagnosis present

## 2019-08-09 NOTE — Consult Note (Signed)
[facility] Face-to-Face Psychiatry Consult   Reason for Consult:Suicidal and Sore Throat  Referring Physician: Dr. Derrill Kay Patient Identification: Michelle Maddox MRN:  [id number] Principal Diagnosis: MDD (major depressive disorder), recurrent episode (HCC) Diagnosis:  Principal Problem:   MDD (major depressive disorder), recurrent episode (HCC)   Total Time spent with patient: 30 minutes  Subjective: "I need outpatient resources for a therapist." Michelle Maddox is a 24 y.o. female patient presented to [facility] ED via POV voluntary. Per the ED nurse note, the patient is here for an itchy throat, and while being triage, she was asked if she felt like hurting herself in the last six months? The patient responded yes to the triage nurse. The patient reports to the writer that she feels overwhelmed with life issues, stating that work has been challenging and has no personal life. She says feeling dissatisfied with her life and has thought about SI but no attempt. 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 She also voiced at the age of 24 years-old she attempted suicide by intentionally overdosing on some medications. She stated she was admitted to [facility] for a week.  She voiced that behavior stamped from having a toxic relationship with her mother. She said since that time; she has not "done anything like that." The patient voiced she lives in her apartment with a roommate. The patient was seen face-to-face by this provider; chart reviewed and consulted with Dr. Derrill Kay and Dr. Jola Babinski on 08/08/2019 due to the patient's care. It was discussed with both providers that the patient does not meet the criteria to be admitted to the psychiatric inpatient unit.  The patient is alert and oriented x 4, calm, cooperative, and mood-congruent with affect on evaluation. The patient does not appear to be responding to internal or external stimuli. Neither is  the patient presenting with any delusional thinking. The patient denies auditory or visual hallucinations. The patient denies any suicidal, homicidal, or self-harm ideations. The patient is not presenting with any psychotic or paranoid behaviors. During an encounter with the patient, she was able to answer questions appropriately.    Plan: The patient is not a safety risk to self or others and does not require psychiatric inpatient admission for stabilization and treatment.  HPI: Per Dr. Derrill Kay: Michelle Maddox is a 24 y.o. female who presents to the emergency department today because of concerns for sore throat and needing Covid test for work however during triage she mentioned she has had thoughts of wanting hurt her self the past 30 days.  Patient states he has a long history of depression.  She feels burned out at her job and that she has no one to talk to.  She states that she is thought about going to sleep and never waking up however has no plans.  In terms of the throat discomfort she describes it as an itchiness.  It has been going on for the past 2 weeks.  She is thinking it could be due to an allergy.  It is worse when she is at her apartment.  She has not tried taking any allergy medications.  Past Psychiatric History: None  Risk to Self:   No Risk to Others:   No Prior Inpatient Therapy:  Yes Prior Outpatient Therapy:  No  Past Medical History: No past medical history on file. No past surgical history on file. Family History: No family history on file. Family Psychiatric  History: Maternal-depression, anxiety and  bipolar Social History:  Social History   Substance and Sexual Activity  Alcohol Use Yes     Social History   Substance and Sexual Activity  Drug Use Yes  . Types: Marijuana    Social History   Socioeconomic History  . Marital status: Single    Spouse name: Not on file  . Number of children: Not on file  . Years of education: Not on file  . Highest  education level: Not on file  Occupational History  . Not on file  Tobacco Use  . Smoking status: Light Tobacco Smoker    Types: Cigarettes  . Smokeless tobacco: Never Used  Substance and Sexual Activity  . Alcohol use: Yes  . Drug use: Yes    Types: Marijuana  . Sexual activity: Not on file  Other Topics Concern  . Not on file  Social History Narrative  . Not on file   Social Determinants of Health   Financial Resource Strain:   . Difficulty of Paying Living Expenses:   Food Insecurity:   . Worried About Programme researcher, broadcasting/film/video in the Last Year:   . Barista in the Last Year:   Transportation Needs:   . Freight forwarder (Medical):   Marland Kitchen Lack of Transportation (Non-Medical):   Physical Activity:   . Days of Exercise per Week:   . Minutes of Exercise per Session:   Stress:   . Feeling of Stress :   Social Connections:   . Frequency of Communication with Friends and Family:   . Frequency of Social Gatherings with Friends and Family:   . Attends Religious Services:   . Active Member of Clubs or Organizations:   . Attends Banker Meetings:   Marland Kitchen Marital Status:    Additional Social History:    Allergies:  No Known Allergies  Labs:  Results for orders placed or performed during the hospital encounter of 08/08/19 (from the past 48 hour(s))  Comprehensive metabolic panel     Status: Abnormal   Collection Time: 08/08/19  4:23 PM  Result Value Ref Range   Sodium 138 135 - 145 mmol/L   Potassium 3.8 3.5 - 5.1 mmol/L   Chloride 104 98 - 111 mmol/L   CO2 27 22 - 32 mmol/L   Glucose, Bld 109 (H) 70 - 99 mg/dL    Comment: Glucose reference range applies only to samples taken after fasting for at least 8 hours.   BUN 9 6 - 20 mg/dL   Creatinine, Ser 0.98 0.44 - 1.00 mg/dL   Calcium 9.8 8.9 - 11.9 mg/dL   Total Protein 8.4 (H) 6.5 - 8.1 g/dL   Albumin 5.0 3.5 - 5.0 g/dL   AST 19 15 - 41 U/L   ALT 15 0 - 44 U/L   Alkaline Phosphatase 55 38 - 126 U/L    Total Bilirubin 0.8 0.3 - 1.2 mg/dL   GFR calc non Af Amer >60 >60 mL/min   GFR calc Af Amer >60 >60 mL/min   Anion gap 7 5 - 15    Comment: Performed at [facility], [address] [place], [place] [address]  Ethanol     Status: None   Collection Time: 08/08/19  4:23 PM  Result Value Ref Range   Alcohol, Ethyl (B) <10 <10 mg/dL    Comment: (NOTE) Lowest detectable limit for serum alcohol is 10 mg/dL. For medical purposes only. Performed at [facility], [address]  [place], [place] [address]   Salicylate level     Status: Abnormal   Collection Time: 08/08/19  4:23 PM  Result Value Ref Range   Salicylate Lvl <7.0 (L) 7.0 - 30.0 mg/dL    Comment: Performed at [facility], [address] [place], [place] [address]  Acetaminophen level     Status: Abnormal   Collection Time: 08/08/19  4:23 PM  Result Value Ref Range   Acetaminophen (Tylenol), Serum <10 (L) 10 - 30 ug/mL    Comment: (NOTE) Therapeutic concentrations vary significantly. A range of 10-30 ug/mL  may be an effective concentration for many patients. However, some  are best treated at concentrations outside of this range. Acetaminophen concentrations >150 ug/mL at 4 hours after ingestion  and >50 ug/mL at 12 hours after ingestion are often associated with  toxic reactions. Performed at [facility], [address] [place], [place] [address]   cbc     Status: None   Collection Time: 08/08/19  4:23 PM  Result Value Ref Range   WBC 5.8 4.0 - 10.5 K/uL   RBC 4.74 3.87 - 5.11 MIL/uL   Hemoglobin 13.8 12.0 - 15.0 g/dL   HCT 10.1 75.1 - 02.5 %   MCV 87.8 80.0 - 100.0 fL   MCH 29.1 26.0 - 34.0 pg   MCHC 33.2 30.0 - 36.0 g/dL   RDW 85.2 77.8 - 24.2 %   Platelets 293 150 - 400 K/uL   nRBC 0.0 0.0 - 0.2 %    Comment: Performed at [facility], [address] [place], [place] [address]  Urine Drug Screen, Qualitative     Status: Abnormal   Collection Time:  08/08/19  4:23 PM  Result Value Ref Range   Tricyclic, Ur Screen NONE DETECTED NONE DETECTED   Amphetamines, Ur Screen NONE DETECTED NONE DETECTED   MDMA (Ecstasy)Ur Screen NONE DETECTED NONE DETECTED   Cocaine Metabolite,Ur [place] NONE DETECTED NONE DETECTED   Opiate, Ur Screen NONE DETECTED NONE DETECTED   Phencyclidine (PCP) Ur S NONE DETECTED NONE DETECTED   Cannabinoid 50 Ng, Ur [place] POSITIVE (A) NONE DETECTED   Barbiturates, Ur Screen NONE DETECTED NONE DETECTED   Benzodiazepine, Ur Scrn NONE DETECTED NONE DETECTED   Methadone Scn, Ur NONE DETECTED NONE DETECTED    Comment: (NOTE) Tricyclics + metabolites, urine    Cutoff 1000 ng/mL Amphetamines + metabolites, urine  Cutoff 1000 ng/mL MDMA (Ecstasy), urine              Cutoff 500 ng/mL Cocaine Metabolite, urine          Cutoff 300 ng/mL Opiate + metabolites, urine        Cutoff 300 ng/mL Phencyclidine (PCP), urine         Cutoff 25 ng/mL Cannabinoid, urine                 Cutoff 50 ng/mL Barbiturates + metabolites, urine  Cutoff 200 ng/mL Benzodiazepine, urine              Cutoff 200 ng/mL Methadone, urine                   Cutoff 300 ng/mL The urine drug screen provides only a preliminary, unconfirmed analytical test result and should not be used for non-medical purposes. Clinical consideration and professional judgment should be applied to any positive drug screen result due to possible interfering substances. A more specific alternate chemical method must be used in order to obtain  a confirmed analytical result. Gas chromatography / mass spectrometry (GC/MS) is the preferred confirmat ory method. Performed at [facility], [address] [place], [place] [address]   Pregnancy, urine POC     Status: None   Collection Time: 08/08/19  6:14 PM  Result Value Ref Range   Preg Test, Ur NEGATIVE NEGATIVE    Comment:        THE SENSITIVITY OF THIS METHODOLOGY IS >24 mIU/mL   SARS Coronavirus 2 by RT PCR (hospital order,  performed in [facility] hospital lab) Nasopharyngeal Nasopharyngeal Swab     Status: None   Collection Time: 08/08/19 10:47 PM   Specimen: Nasopharyngeal Swab  Result Value Ref Range   SARS Coronavirus 2 NEGATIVE NEGATIVE    Comment: (NOTE) SARS-CoV-2 target nucleic acids are NOT DETECTED. The SARS-CoV-2 RNA is generally detectable in upper and lower respiratory specimens during the acute phase of infection. The lowest concentration of SARS-CoV-2 viral copies this assay can detect is 250 copies / mL. A negative result does not preclude SARS-CoV-2 infection and should not be used as the sole basis for treatment or other patient management decisions.  A negative result may occur with improper specimen collection / handling, submission of specimen other than nasopharyngeal swab, presence of viral mutation(s) within the areas targeted by this assay, and inadequate number of viral copies (<250 copies / mL). A negative result must be combined with clinical observations, patient history, and epidemiological information. Fact Sheet for Patients:   BoilerBrush.com.cyhttps://www.fda.gov/media/136312/download Fact Sheet for Healthcare Providers: https://pope.com/https://www.fda.gov/media/136313/download This test is not yet approved or cleared  by the [place] FDA and has been authorized for detection and/or diagnosis of SARS-CoV-2 by FDA under an Emergency Use Authorization (EUA).  This EUA will remain in effect (meaning this test can be used) for the duration of the COVID-19 declaration under Section 564(b)(1) of the Act, 21 U.S.C. section 360bbb-3(b)(1), unless the authorization is terminated or revoked sooner. Performed at [facility], [address] [place], [place] [address]     No current facility-administered medications for this encounter.   No current outpatient medications on file.    Musculoskeletal: Strength & Muscle Tone: within normal limits Gait & Station: normal Patient leans:  N/A  Psychiatric Specialty Exam: Physical Exam  Nursing note and vitals reviewed. Constitutional: She is oriented to person, place, and time. She appears well-developed and well-nourished.  Cardiovascular: Normal rate.  Respiratory: Effort normal.  Musculoskeletal:        General: Normal range of motion.     Cervical back: Normal range of motion and neck supple.  Neurological: She is alert and oriented to person, place, and time.  Psychiatric: She has a normal mood and affect. Her behavior is normal.    Review of Systems  Psychiatric/Behavioral: Positive for sleep disturbance. The patient is nervous/anxious.   All other systems reviewed and are negative.   Blood pressure 117/68, pulse 81, temperature 98.5 F (36.9 C), temperature source Oral, resp. rate 16, height 5' (1.524 m), weight 52.2 kg, SpO2 99 %.Body mass index is 22.46 kg/m.  General Appearance: Casual  Eye Contact:  Good  Speech:  Clear and Coherent  Volume:  Normal  Mood:  Anxious and Depressed  Affect:  Congruent  Thought Process:  Coherent  Orientation:  Full (Time, Place, and Person)  Thought Content:  WDL and Logical  Suicidal Thoughts:  No  Homicidal Thoughts:  No  Memory:  Immediate;   Good Recent;   Good Remote;  Good  Judgement:  Fair  Insight:  Fair  Psychomotor Activity:  Normal  Concentration:  Concentration: Good and Attention Span: Good  Recall:  Good  Fund of Knowledge:  Good  Language:  Good  Akathisia:  Negative  Handed:  Right  AIMS (if indicated):     Assets:  Desire for Improvement Social Support  ADL's:  Intact  Cognition:  WNL  Sleep:    Insomnia     Treatment Plan Summary: Plan Patient does not meet criteria for psychiatric inpatient admission.   Disposition: No evidence of imminent risk to self or others at present.   Patient does not meet criteria for psychiatric inpatient admission. Supportive therapy provided about ongoing stressors.  Caroline Sauger, NP 08/09/2019  1:45 AM

## 2019-11-05 ENCOUNTER — Emergency Department: Payer: Medicaid Other

## 2019-11-05 ENCOUNTER — Emergency Department
Admission: EM | Admit: 2019-11-05 | Discharge: 2019-11-05 | Disposition: A | Discharge status: Home / Self Care | Payer: Medicaid Other | Attending: Emergency Medicine | Admitting: Emergency Medicine

## 2019-11-05 ENCOUNTER — Other Ambulatory Visit: Payer: Self-pay

## 2019-11-05 ENCOUNTER — Encounter: Payer: Self-pay | Admitting: Emergency Medicine

## 2019-11-05 DIAGNOSIS — R102 Pelvic and perineal pain: Secondary | ICD-10-CM | POA: Diagnosis not present

## 2019-11-05 DIAGNOSIS — Z3A09 9 weeks gestation of pregnancy: Secondary | ICD-10-CM | POA: Insufficient documentation

## 2019-11-05 DIAGNOSIS — O26899 Other specified pregnancy related conditions, unspecified trimester: Secondary | ICD-10-CM

## 2019-11-05 DIAGNOSIS — O99331 Smoking (tobacco) complicating pregnancy, first trimester: Secondary | ICD-10-CM | POA: Insufficient documentation

## 2019-11-05 DIAGNOSIS — F17210 Nicotine dependence, cigarettes, uncomplicated: Secondary | ICD-10-CM | POA: Insufficient documentation

## 2019-11-05 DIAGNOSIS — O219 Vomiting of pregnancy, unspecified: Secondary | ICD-10-CM

## 2019-11-05 DIAGNOSIS — O26891 Other specified pregnancy related conditions, first trimester: Secondary | ICD-10-CM | POA: Diagnosis not present

## 2019-11-05 DIAGNOSIS — O468X1 Other antepartum hemorrhage, first trimester: Secondary | ICD-10-CM

## 2019-11-05 LAB — COMPREHENSIVE METABOLIC PANEL
ALT: 11 U/L (ref 0–44)
AST: 16 U/L (ref 15–41)
Albumin: 4.7 g/dL (ref 3.5–5.0)
Alkaline Phosphatase: 40 U/L (ref 38–126)
Anion gap: 12 (ref 5–15)
BUN: 8 mg/dL (ref 6–20)
CO2: 23 mmol/L (ref 22–32)
Calcium: 9.8 mg/dL (ref 8.9–10.3)
Chloride: 103 mmol/L (ref 98–111)
Creatinine, Ser: 0.61 mg/dL (ref 0.44–1.00)
GFR calc Af Amer: >60 mL/min (ref >60)
GFR calc non Af Amer: >60 mL/min (ref >60)
Glucose, Bld: 88 mg/dL (ref 70–99)
Potassium: 3.5 mmol/L (ref 3.5–5.1)
Sodium: 138 mmol/L (ref 135–145)
Total Bilirubin: 0.9 mg/dL (ref 0.3–1.2)
Total Protein: 8 g/dL (ref 6.5–8.1)

## 2019-11-05 LAB — CBC
HCT: 37.4 % (ref 36.0–46.0)
Hemoglobin: 12.7 g/dL (ref 12.0–15.0)
MCH: 29.3 pg (ref 26.0–34.0)
MCHC: 34 g/dL (ref 30.0–36.0)
MCV: 86.4 fL (ref 80.0–100.0)
Platelets: 256 10*3/uL (ref 150–400)
RBC: 4.33 MIL/uL (ref 3.87–5.11)
RDW: 12.2 % (ref 11.5–15.5)
WBC: 5.9 10*3/uL (ref 4.0–10.5)
nRBC: 0 % (ref 0.0–0.2)

## 2019-11-05 LAB — ABO/RH: ABO/RH(D): O POS

## 2019-11-05 LAB — LIPASE, BLOOD: Lipase: 23 U/L (ref 11–51)

## 2019-11-05 LAB — HCG, QUANTITATIVE, PREGNANCY: hCG, Beta Chain, Quant, S: 192962 m[IU]/mL — ABNORMAL HIGH (ref <5)

## 2019-11-05 MED ORDER — DOXYLAMINE-PYRIDOXINE 10-10 MG PO TBEC: DELAYED_RELEASE_TABLET | ORAL | 3 refills | Status: AC

## 2019-11-05 MED ORDER — ONDANSETRON 4 MG PO TBDP
4.0000 mg | ORAL_TABLET | Freq: Once | ORAL | Status: AC
  Administered 2019-11-05: 4 mg via ORAL
  Filled 2019-11-05: qty 1

## 2019-11-05 NOTE — ED Notes (Signed)
See triage note.

## 2019-11-05 NOTE — ED Triage Notes (Signed)
Patient to ER for c/o N/V during pregnancy. Patient is approx [redacted] weeks pregnant (had 4 episodes of vomiting yesterday).

## 2019-11-05 NOTE — Discharge Instructions (Signed)
Follow-up with the [facility] department for prenatal care.  Return emergency department if worsening.  Take the medication for nausea starting at night.  See if that helps, if not better in 2-3 days then take it in the morning and at night

## 2019-11-05 NOTE — ED Provider Notes (Signed)
[facility] Emergency Department Provider Note  ____________________________________________   First MD Initiated Contact with Patient 11/05/19 1532     (approximate)  I have reviewed the triage vital signs and the nursing notes.   HISTORY  Chief Complaint Emesis    HPI Michelle Maddox is a 24 y.o. female presents emergency department complaining of left lower quadrant pain. Patient states she is about [redacted] weeks pregnant. She has had a lot of nausea and vomiting. No vomiting today. Four episodes yesterday. States even if by just eating crackers she has vomiting. No fever or chills. No bleeding. Unsure of how far along she is she thinks approximately 9 weeks.    History reviewed. No pertinent past medical history.  Patient Active Problem List   Diagnosis Date Noted  . MDD (major depressive disorder), recurrent episode (HCC) 08/09/2019    History reviewed. No pertinent surgical history.  Prior to Admission medications   Not on File    Allergies Patient has no known allergies.  No family history on file.  Social History Social History   Tobacco Use  . Smoking status: Light Tobacco Smoker    Types: Cigarettes  . Smokeless tobacco: Never Used  Substance Use Topics  . Alcohol use: Yes  . Drug use: Yes    Types: Marijuana    Review of Systems  Constitutional: No fever/chills Eyes: No visual changes. ENT: No sore throat. Respiratory: Denies cough Cardiovascular: Denies chest pain Gastrointestinal: Positive abdominal pain Genitourinary: Negative for dysuria. No vaginal bleeding Musculoskeletal: Negative for back pain. Skin: Negative for rash. Psychiatric: no mood changes,     ____________________________________________   PHYSICAL EXAM:  VITAL SIGNS: ED Triage Vitals  Enc Vitals Group     BP 11/05/19 1226 129/68     Pulse Rate 11/05/19 1226 76     Resp 11/05/19 1226 18     Temp 11/05/19 1226 97.8 F (36.6 C)     Temp  Source 11/05/19 1226 Oral     SpO2 11/05/19 1226 98 %     Weight 11/05/19 1228 110 lb (49.9 kg)     Height 11/05/19 1228 5' (1.524 m)     Head Circumference --      Peak Flow --      Pain Score 11/05/19 1227 0     Pain Loc --      Pain Edu? --      Excl. in GC? --     Constitutional: Alert and oriented. Well appearing and in no acute distress. Eyes: Conjunctivae are normal.  Head: Atraumatic. Nose: No congestion/rhinnorhea. Mouth/Throat: Mucous membranes are moist.   Neck:  supple no lymphadenopathy noted Cardiovascular: Normal rate, regular rhythm. Heart sounds are normal Respiratory: Normal respiratory effort.  No retractions, lungs c t a  Abd: soft tender in the left lower quadrant bs normal all 4 quad GU: deferred by the patient Musculoskeletal: FROM all extremities, warm and well perfused Neurologic:  Normal speech and language.  Skin:  Skin is warm, dry and intact. No rash noted. Psychiatric: Mood and affect are normal. Speech and behavior are normal.  ____________________________________________   LABS (all labs ordered are listed, but only abnormal results are displayed)  Labs Reviewed  HCG, QUANTITATIVE, PREGNANCY - Abnormal; Notable for the following components:      Result Value   hCG, Beta Chain, Quant, S [id number] (*)    All other components within normal limits  LIPASE, BLOOD  COMPREHENSIVE METABOLIC PANEL  CBC  URINALYSIS, COMPLETE (  UACMP) WITH MICROSCOPIC  POC URINE PREG, ED  ABO/RH   ____________________________________________   ____________________________________________  RADIOLOGY  Ultrasound OB less than 14 weeks with transvaginal shows a IUP, 9 week fetus , tiny subchorionic hemorrhage  ____________________________________________   PROCEDURES  Procedure(s) performed: No  Procedures    ____________________________________________   INITIAL IMPRESSION / ASSESSMENT AND PLAN / ED COURSE  Pertinent labs & imaging results that were  available during my care of the patient were reviewed by me and considered in my medical decision making (see chart for details).   Patient is 24 year old female presents emergency department with nausea and vomiting in pregnancy along with left lower quadrant pain. No vaginal bleeding. See HPI  Physical exam shows patient to appear well. Left lower quadrant is tender to palpation.  DDx: Vomiting in pregnancy, ligament pain, ectopic pregnancy, threatened miscarriage  CBC is normal, comprehensive metabolic panel is normal, lipase normal, beta hCG is 192,962, ABO/Rh is O+  Ultrasound OB less than 14 weeks, Zofran 4 mg ODT   Ultrasound shows a IUP, 9-week fetus, tiny subchorionic hemorrhage.  Everything was explained to the patient.  She was given a prescription for Diclegis just.  She is to follow-up with [facility] department.  Return emergency department worsening.  She is discharged stable condition.  Michelle Maddox was evaluated in Emergency Department on 11/05/2019 for the symptoms described in the history of present illness. She was evaluated in the context of the global COVID-19 pandemic, which necessitated consideration that the patient might be at risk for infection with the SARS-CoV-2 virus that causes COVID-19. Institutional protocols and algorithms that pertain to the evaluation of patients at risk for COVID-19 are in a state of rapid change based on information released by regulatory bodies including the CDC and federal and state organizations. These policies and algorithms were followed during the patient's care in the ED.    As part of my medical decision making, I reviewed the following data within the electronic MEDICAL RECORD NUMBER Nursing notes reviewed and incorporated, Labs reviewed , Old chart reviewed, Radiograph reviewed , Notes from prior ED visits and [place] Controlled Substance Database  ____________________________________________   FINAL CLINICAL IMPRESSION(S) /  ED DIAGNOSES  Final diagnoses:  Pelvic pain during pregnancy      NEW MEDICATIONS STARTED DURING THIS VISIT:  New Prescriptions   No medications on file     Note:  This document was prepared using Dragon voice recognition software and may include unintentional dictation errors.    Faythe Ghee, PA-C 11/05/19 1711    [place], MD 11/05/19 [phone]

## 2019-11-05 NOTE — ED Notes (Signed)
See triage note  Presents with n/v  States she is about [redacted] weeks pregnant     No vaginal bleeding noted

## 2021-09-13 IMAGING — US US OB < 14 WEEKS - US OB TV
1 series · 14 of 28 positions shown · non-contrast
Comparison: None.

CLINICAL DATA: Pelvic pain and nausea and vomiting for 3 weeks.

EXAM:
OBSTETRIC <14 WK US AND TRANSVAGINAL OB US
TECHNIQUE: Both transabdominal and transvaginal ultrasound examinations were
performed for complete evaluation of the gestation as well as the
maternal uterus, adnexal regions, and pelvic cul-de-sac.
Transvaginal technique was performed to assess early pregnancy.

[Series 1: us ob less than 14 weeks with ob transvaginal · 14 of 82 slices shown]
[im 4/82]
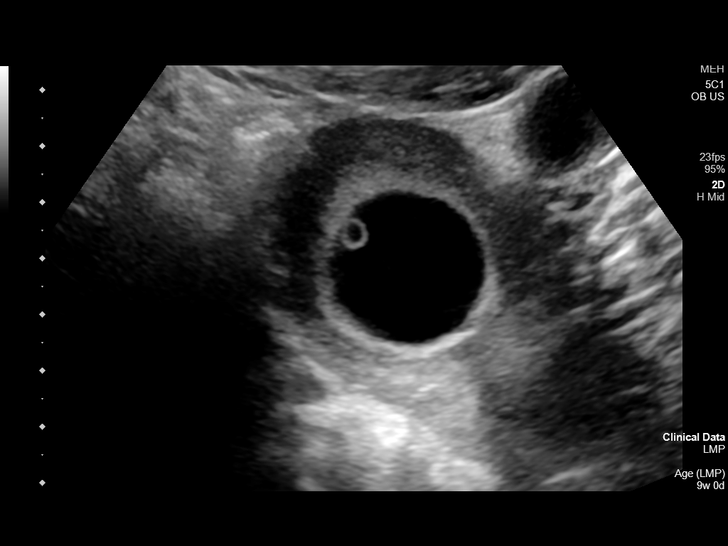
[im 10/82]
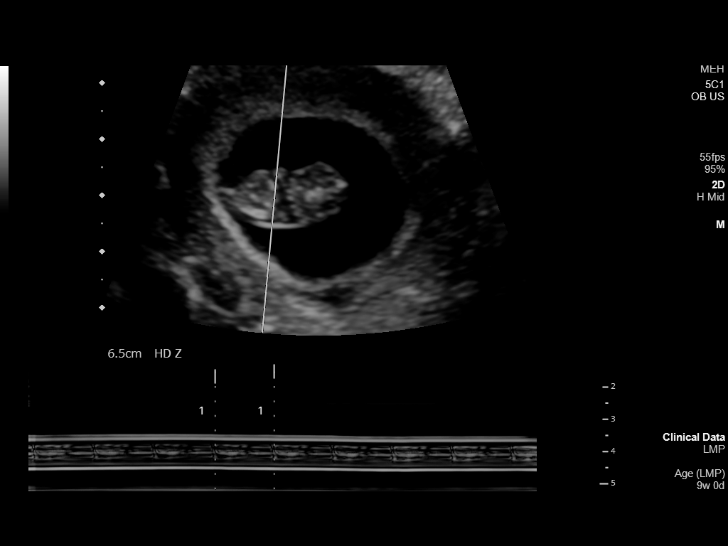
[im 16/82]
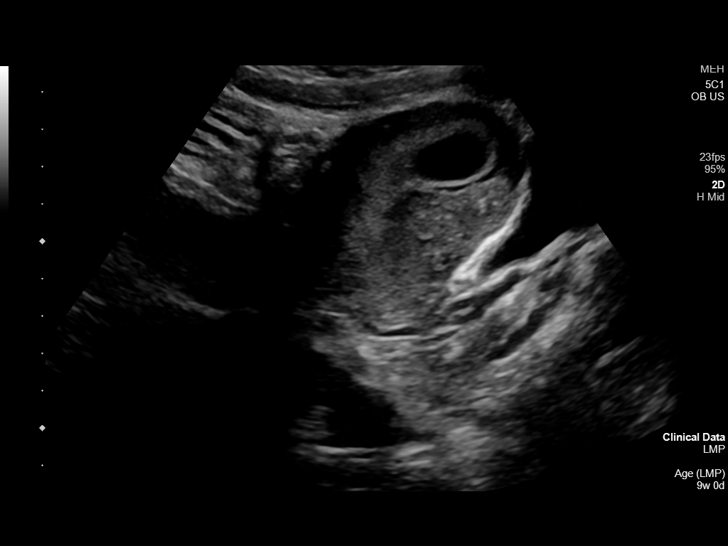
[im 22/82]
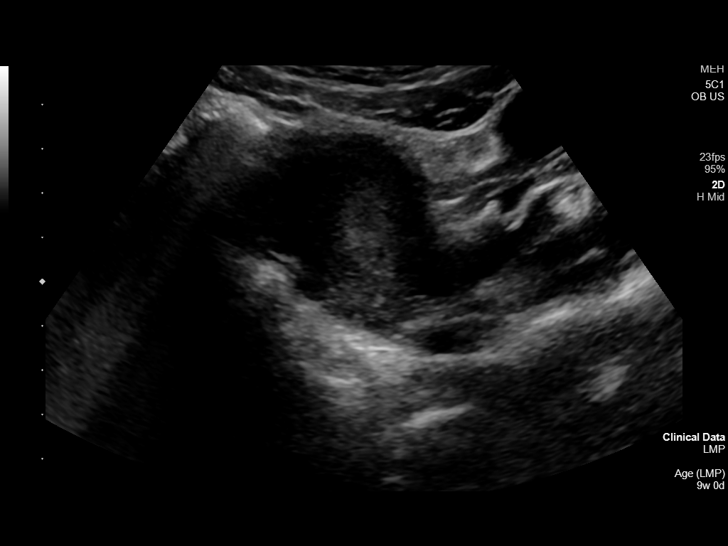
[im 28/82]
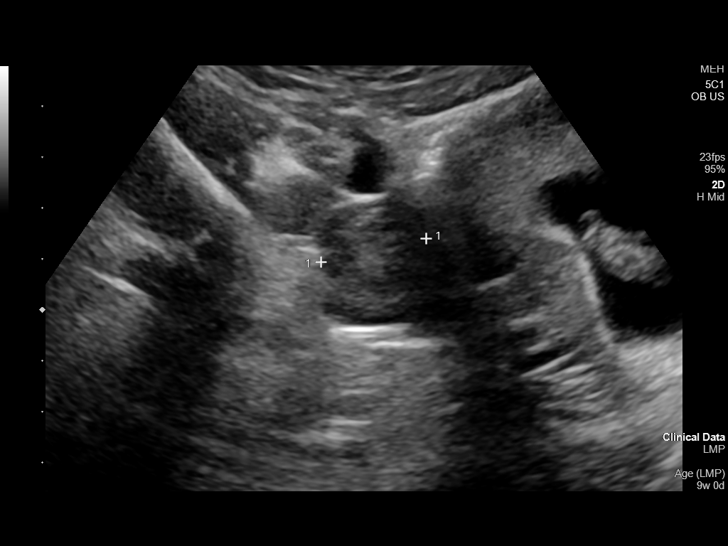
[im 34/82]
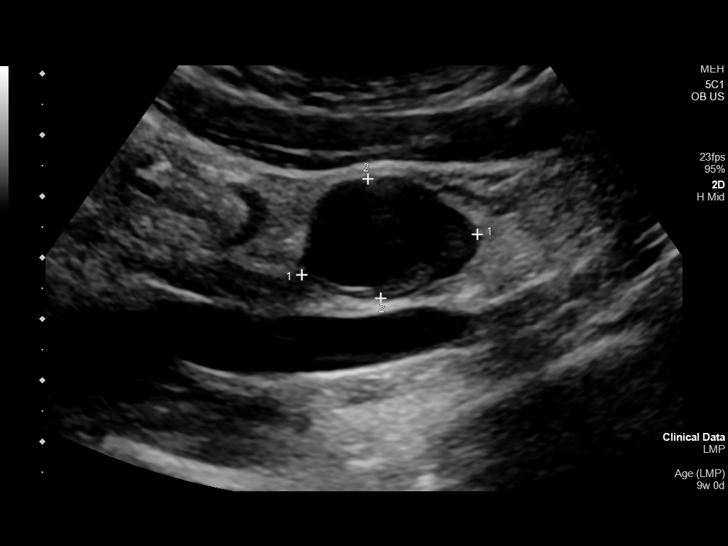
[im 40/82]
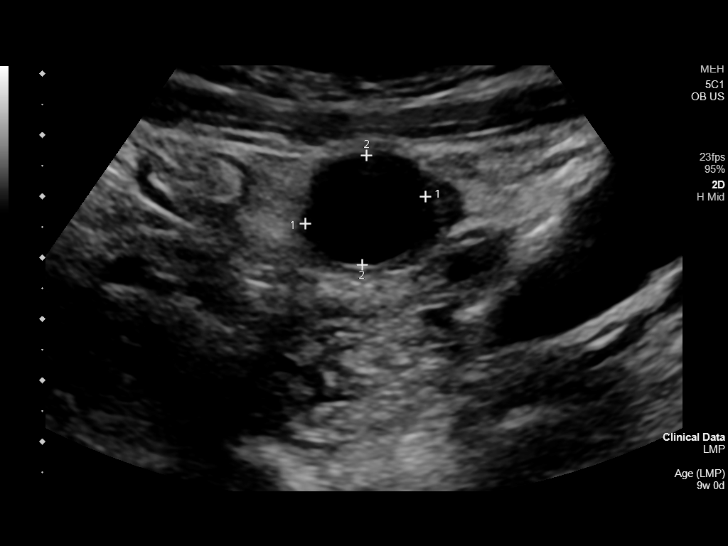
[im 46/82]
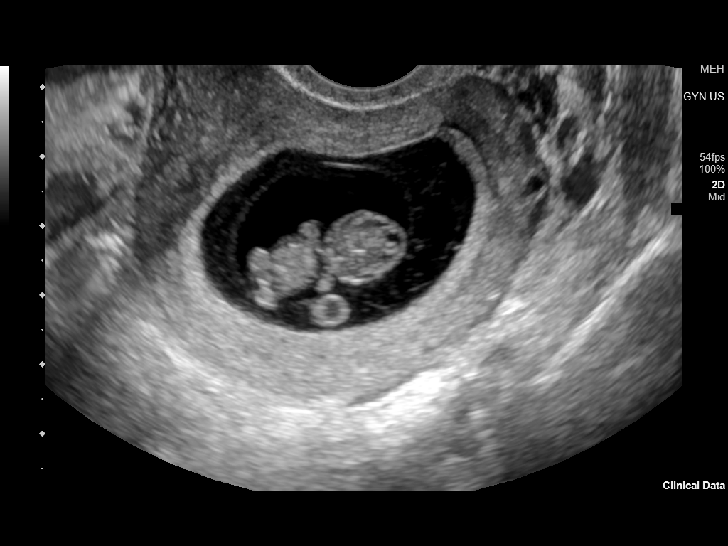
[im 52/82]
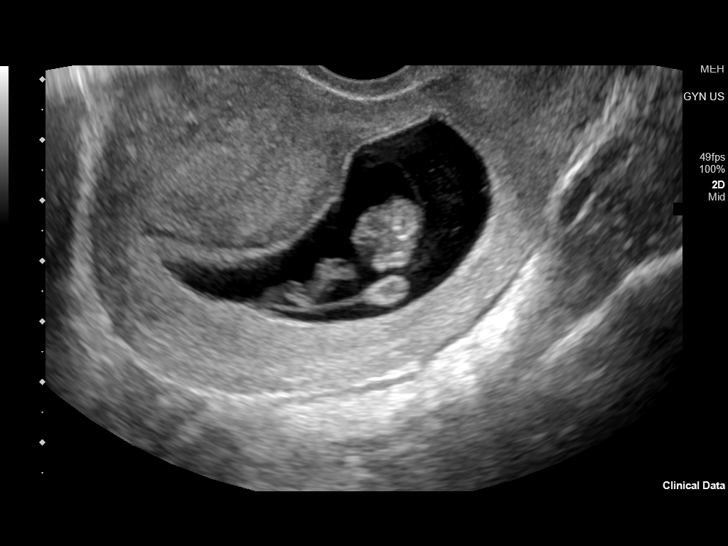
[im 58/82]
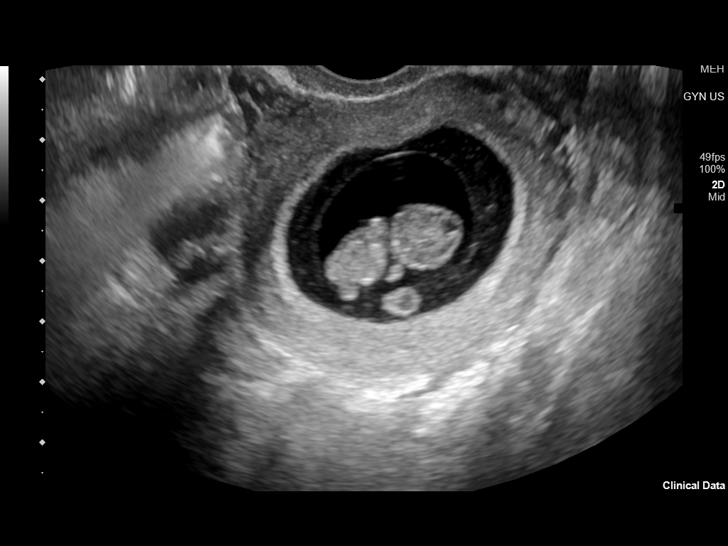
[im 64/82]
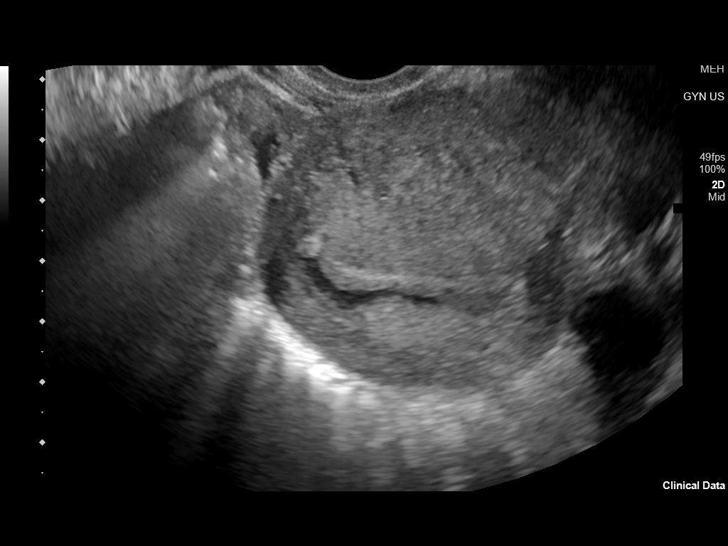
[im 70/82]
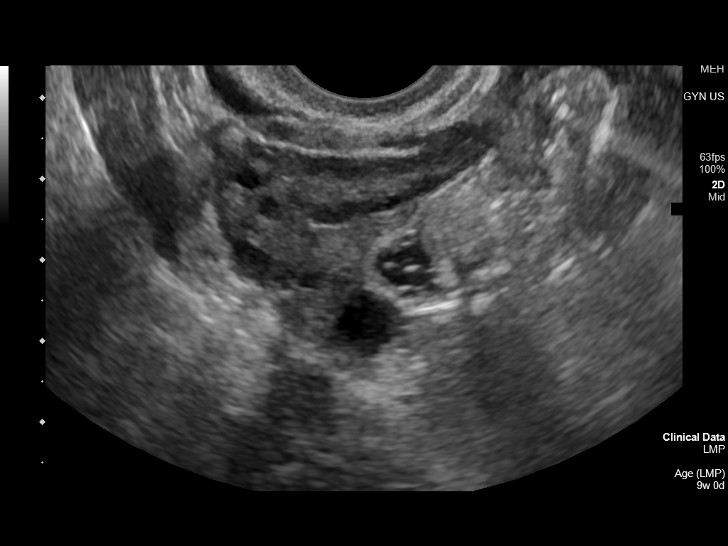
[im 76/82]
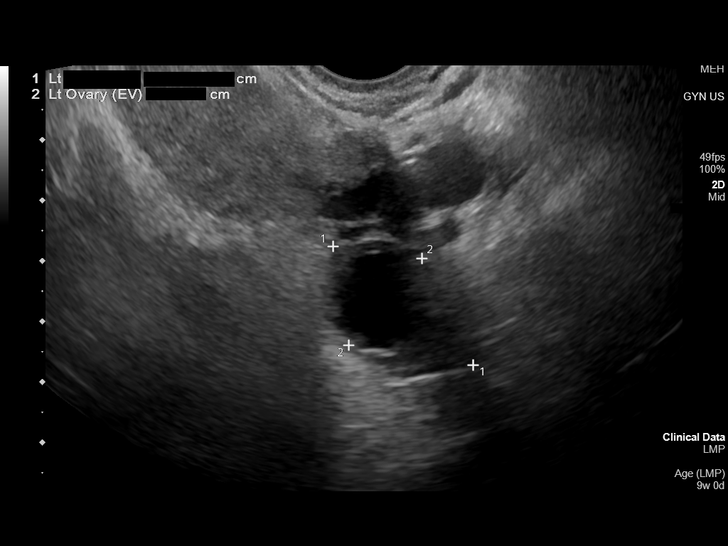
[im 82/82]
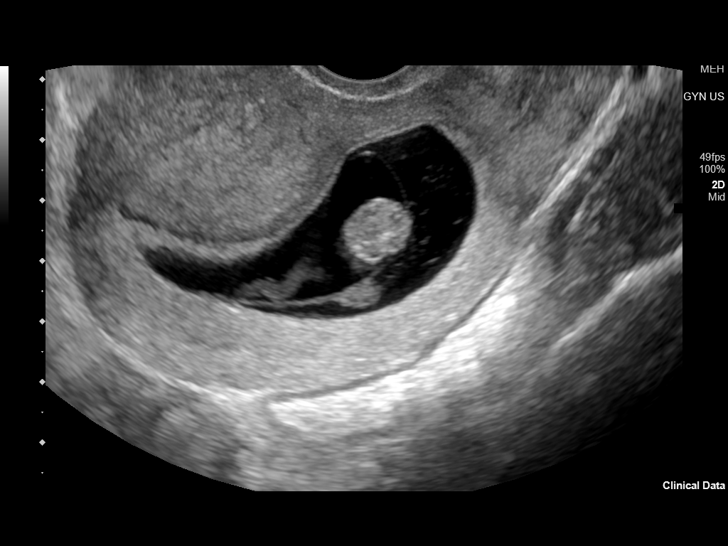

[14 of 28 positions shown; findings below may reference images not displayed]

FINDINGS: Intrauterine gestational sac: Single

Yolk sac:  Visualized.

Embryo:  Visualized.

Cardiac Activity: Visualized.

Heart Rate: 173 bpm

CRL:  24 mm   9 w   0 d                  US EDC: 06/09/2020

Subchorionic hemorrhage:  Tiny subchorionic hemorrhage noted.

Maternal uterus/adnexae: Both ovaries are normal in appearance. No
mass or abnormal free fluid identified.
IMPRESSION: Single living IUP with estimated gestational age of 9 weeks 0 days,
and US EDC of 06/09/2020.

Tiny subchorionic hemorrhage noted.
